# Patient Record
Sex: Male | Born: 1995 | Race: Black or African American | Hispanic: No | Marital: Single | Smoking: Never smoker
Health system: Southern US, Community
[De-identification: ages and names within clinical notes are randomized; demographics above are authoritative.]

---

## 2018-03-08 ENCOUNTER — Other Ambulatory Visit: Payer: Self-pay | Admitting: Family Medicine

## 2018-03-08 DIAGNOSIS — R1900 Intra-abdominal and pelvic swelling, mass and lump, unspecified site: Secondary | ICD-10-CM

## 2018-03-16 ENCOUNTER — Other Ambulatory Visit: Payer: Self-pay

## 2018-03-23 ENCOUNTER — Other Ambulatory Visit: Payer: Self-pay

## 2018-08-23 ENCOUNTER — Other Ambulatory Visit: Payer: Self-pay | Admitting: Family

## 2018-08-23 DIAGNOSIS — R1906 Epigastric swelling, mass or lump: Secondary | ICD-10-CM

## 2018-08-27 ENCOUNTER — Other Ambulatory Visit: Payer: Self-pay

## 2018-09-03 ENCOUNTER — Other Ambulatory Visit: Payer: Self-pay

## 2018-09-10 ENCOUNTER — Ambulatory Visit
Admission: RE | Admit: 2018-09-10 | Discharge: 2018-09-10 | Disposition: A | Discharge status: Home / Self Care | Payer: Managed Care, Other (non HMO) | Source: Ambulatory Visit | Attending: Family | Admitting: Family

## 2018-09-10 DIAGNOSIS — R1906 Epigastric swelling, mass or lump: Secondary | ICD-10-CM

## 2018-09-17 ENCOUNTER — Ambulatory Visit (HOSPITAL_COMMUNITY)
Admission: EM | Admit: 2018-09-17 | Discharge: 2018-09-17 | Disposition: A | Discharge status: Home / Self Care | Payer: Managed Care, Other (non HMO) | Attending: Emergency Medicine | Admitting: Emergency Medicine

## 2018-09-17 ENCOUNTER — Encounter (HOSPITAL_COMMUNITY): Payer: Self-pay

## 2018-09-17 ENCOUNTER — Other Ambulatory Visit: Payer: Self-pay

## 2018-09-17 DIAGNOSIS — R109 Unspecified abdominal pain: Secondary | ICD-10-CM | POA: Diagnosis present

## 2018-09-17 DIAGNOSIS — R1013 Epigastric pain: Secondary | ICD-10-CM | POA: Insufficient documentation

## 2018-09-17 DIAGNOSIS — K5900 Constipation, unspecified: Secondary | ICD-10-CM

## 2018-09-17 DIAGNOSIS — N5319 Other ejaculatory dysfunction: Secondary | ICD-10-CM | POA: Diagnosis not present

## 2018-09-17 MED ORDER — OMEPRAZOLE 20 MG PO CPDR: 20.0000 mg | DELAYED_RELEASE_CAPSULE | Freq: Every day | ORAL | 0 refills | Status: AC

## 2018-09-17 NOTE — ED Triage Notes (Signed)
Pt states he has abdominal pains for 4 months. Pt states its hurts when he starts to eat. Pt states he also has painful erections this has been going for 2 months.

## 2018-09-17 NOTE — ED Provider Notes (Signed)
[facility]    CSN: [id number] Arrival date & time: 09/17/18  0846     History   Chief Complaint Chief Complaint  Patient presents with  . Abdominal Pain    HPI Bob Parsons is a 22 y.o. male.   Bob Parsons presents with complaints of abdominal pain which has been ongoing for the past 4 months. Worse with eating. Pain near umbilicus. States he also feels pain sometimes when he is playing basketball like there is a "blockage" to upper abdomen. No fevers. No nausea or vomiting. Still eating and drinking although feels full quickly. Has been constipation. Started using miralax two days ago, had a small BM yesterday. Also complaints of his ejaculation feeling "different" for the past two months. States he experiences erection but his penis feels numb-like. He ejaculates but feels it doesn't feel the same. No pain. No penile discharge. No redness or swelling to scrotum or testicles. No sores or lesions. No pain or burning with urination. Denies any previous similar. Has not been taking any medications for any of his symptoms. Saw his school clinic in regards to his abdomen and had a complete abdominal ultrasound which was negative. Doesn't have a PCP.     ROS per HPI.      History reviewed. No pertinent past medical history.  There are no active problems to display for this patient.   History reviewed. No pertinent surgical history.     Home Medications    Prior to Admission medications   Medication Sig Start Date End Date Taking? Authorizing Provider  omeprazole (PRILOSEC) 20 MG capsule Take 1 capsule (20 mg total) by mouth daily. 09/17/18   Georgetta Haber, NP    Family History History reviewed. No pertinent family history.  Social History Social History   Tobacco Use  . Smoking status: Never Smoker  . Smokeless tobacco: Never Used  Substance Use Topics  . Alcohol use: Not on file  . Drug use: Not on file     Allergies   Patient has no allergy  information on record.   Review of Systems Review of Systems   Physical Exam Triage Vital Signs ED Triage Vitals  Enc Vitals Group     BP 09/17/18 0909 125/80     Pulse Rate 09/17/18 0909 74     Resp 09/17/18 0909 16     Temp 09/17/18 0909 98.5 F (36.9 C)     Temp Source 09/17/18 0909 Oral     SpO2 09/17/18 0909 100 %     Weight 09/17/18 0919 170 lb (77.1 kg)     Height --      Head Circumference --      Peak Flow --      Pain Score --      Pain Loc --      Pain Edu? --      Excl. in GC? --    No data found.  Updated Vital Signs BP 125/80 (BP Location: Right Arm)   Pulse 74   Temp 98.5 F (36.9 C) (Oral)   Resp 16   Wt 170 lb (77.1 kg)   SpO2 100%    Physical Exam  Constitutional: He is oriented to person, place, and time. He appears well-developed and well-nourished.  Cardiovascular: Normal rate and regular rhythm.  Pulmonary/Chest: Effort normal and breath sounds normal.  Abdominal: Soft. Normal appearance and bowel sounds are normal. There is no tenderness. There is no rigidity, no rebound, no guarding, no CVA  tenderness, no tenderness at McBurney's point and negative Murphy's sign. No hernia.  Denies scrotal redness, swelling, pain; denies sores or lesions; gu exam deferred   Neurological: He is alert and oriented to person, place, and time.  Skin: Skin is warm and dry.     UC Treatments / Results  Labs (all labs ordered are listed, but only abnormal results are displayed) Labs Reviewed  URINE CYTOLOGY ANCILLARY ONLY    EKG None  Radiology No results found.  Procedures Procedures (including critical care time)  Medications Ordered in UC Medications - No data to display  Initial Impression / Assessment and Plan / UC Course  I have reviewed the triage vital signs and the nursing notes.  Pertinent labs & imaging results that were available during my care of the patient were reviewed by me and considered in my medical decision making (see chart  for details).     Non toxic in appearance. Afebrile. Negative abdominal ultrasound on 9/30. No acute symptoms, taking PO, no nausea or vomiting. Has been constipated. Encouraged continued miralax use. Daily miralax. No acute complaints with penis or scrotum. Std screen collected and pending. Encouraged follow up with urology and/or PCP as needed for persistent symptoms. Patient verbalized understanding and agreeable to plan.   Final Clinical Impressions(s) / UC Diagnoses   Final diagnoses:  Epigastric pain  Constipation, unspecified constipation type  Abnormal ejaculation     Discharge Instructions     Please continue with daily miralax to promote regular bowel movements.  Daily omeprazole, see food recommendations to try to help with symptoms.  Please establish with a primary care provider for recheck if your symptoms persist.  Please follow up with urology as needed for further evaluation of your ejaculation concerns.  We are screening you today for STD's to ensure this is not the source of your discomfort and will call if any tests are positive.    ED Prescriptions    Medication Sig Dispense Auth. Provider   omeprazole (PRILOSEC) 20 MG capsule Take 1 capsule (20 mg total) by mouth daily. 30 capsule Georgetta Haber, NP     Controlled Substance Prescriptions [place] Controlled Substance Registry consulted? Not Applicable   Georgetta Haber, NP 09/17/18 1018

## 2018-09-17 NOTE — ED Notes (Signed)
Urine placed in lab 

## 2018-09-17 NOTE — Discharge Instructions (Addendum)
Please continue with daily miralax to promote regular bowel movements.  Daily omeprazole, see food recommendations to try to help with symptoms.  Please establish with a primary care provider for recheck if your symptoms persist.  Please follow up with urology as needed for further evaluation of your ejaculation concerns.  We are screening you today for STD's to ensure this is not the source of your discomfort and will call if any tests are positive.

## 2018-09-18 LAB — URINE CYTOLOGY ANCILLARY ONLY
CHLAMYDIA, DNA PROBE: NEGATIVE
NEISSERIA GONORRHEA: NEGATIVE
Trichomonas: NEGATIVE

## 2020-06-06 IMAGING — US US ABDOMEN COMPLETE
1 series · 13 of 25 positions shown · non-contrast
Comparison: None.

CLINICAL DATA: Palpable fullness epigastric region

EXAM:
ABDOMEN ULTRASOUND COMPLETE

[Series 1: us abdomen complete · 0.18mm/px · 13 of 91 slices shown]
[im 1/91]
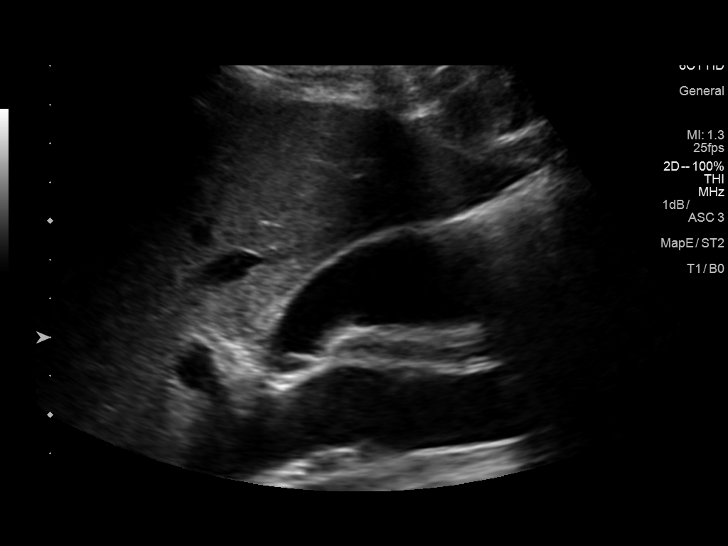
[im 8/91]
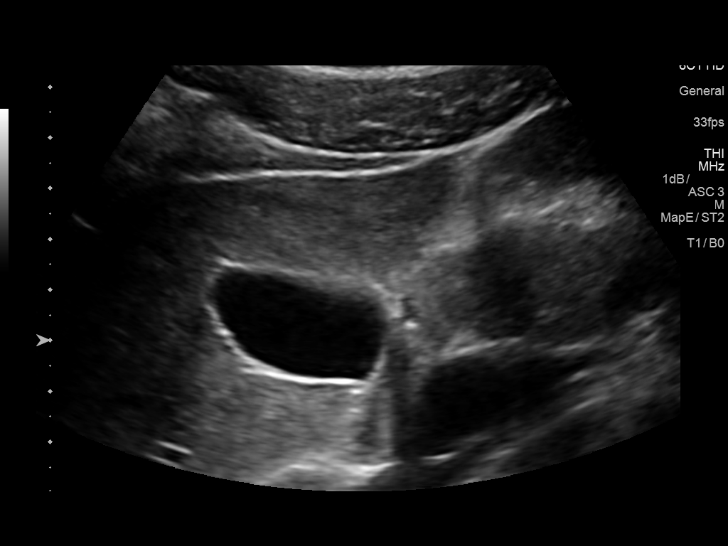
[im 16/91]
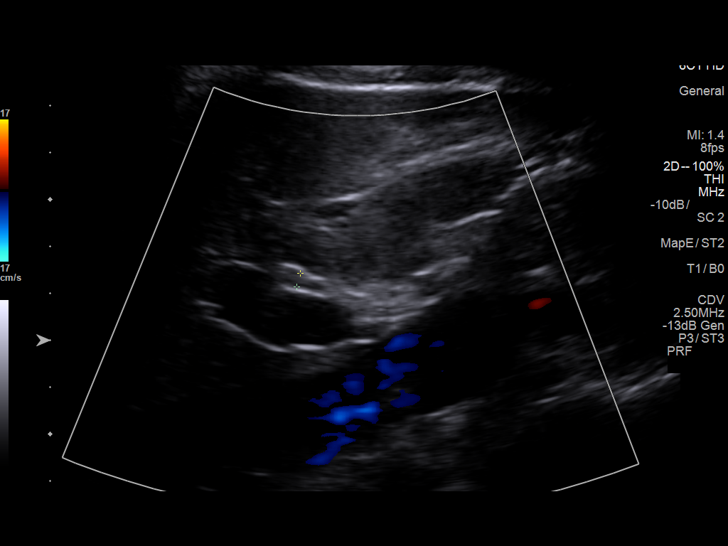
[im 23/91]
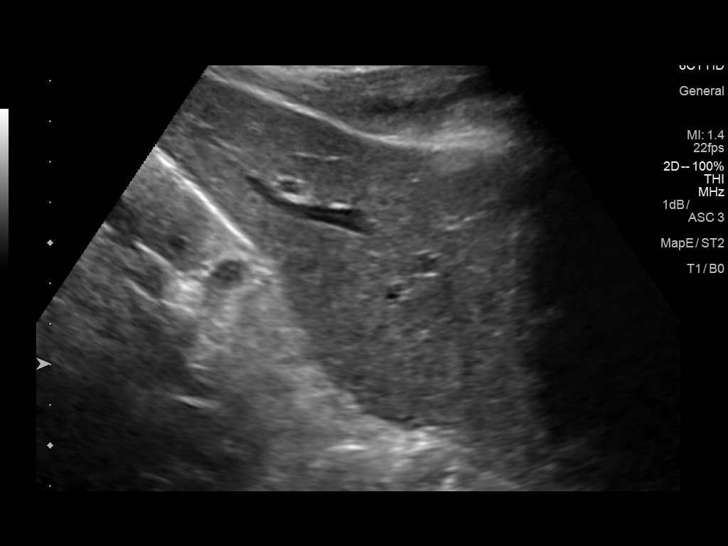
[im 31/91]
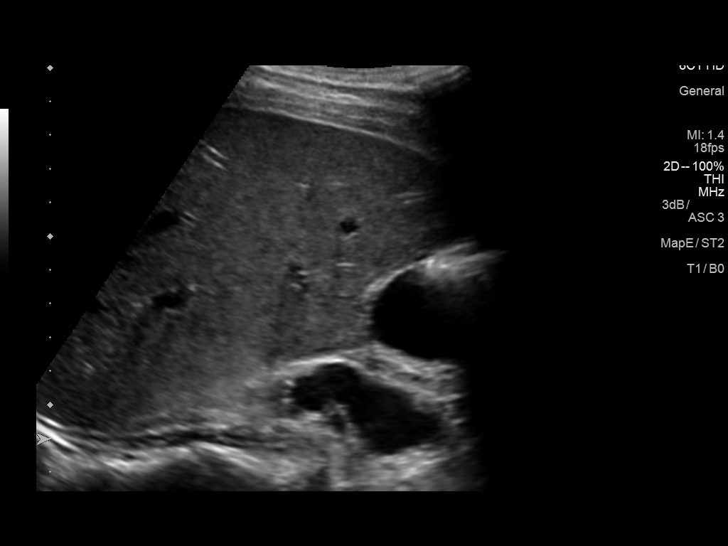
[im 38/91]
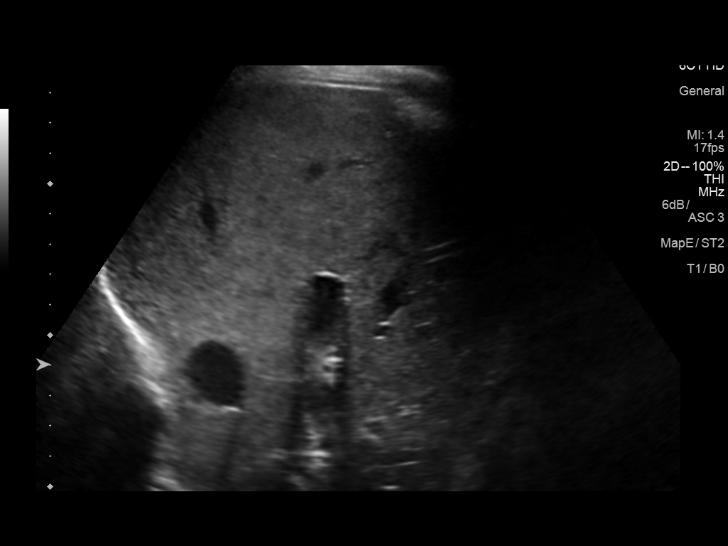
[im 46/91]
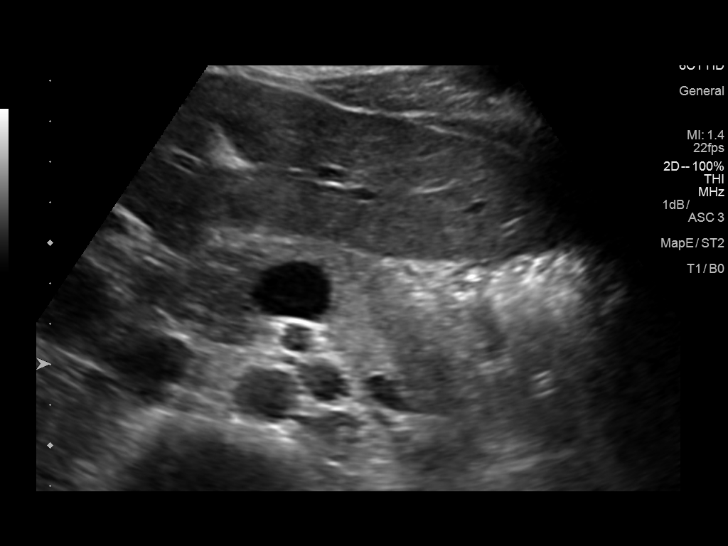
[im 53/91]
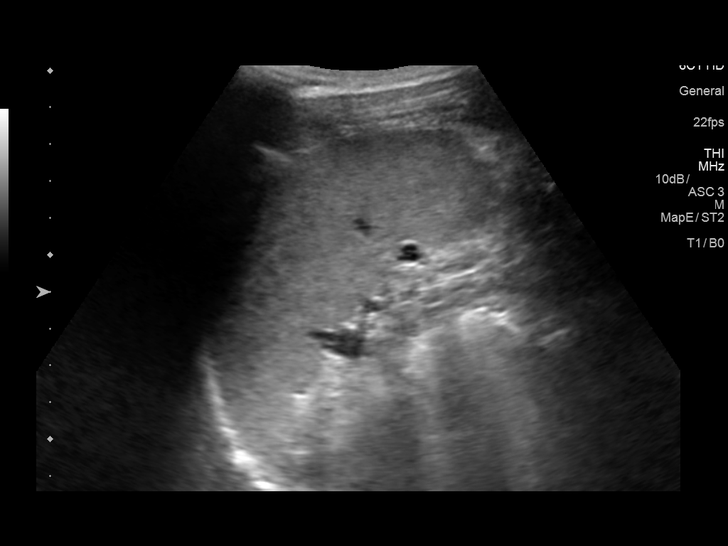
[im 61/91]
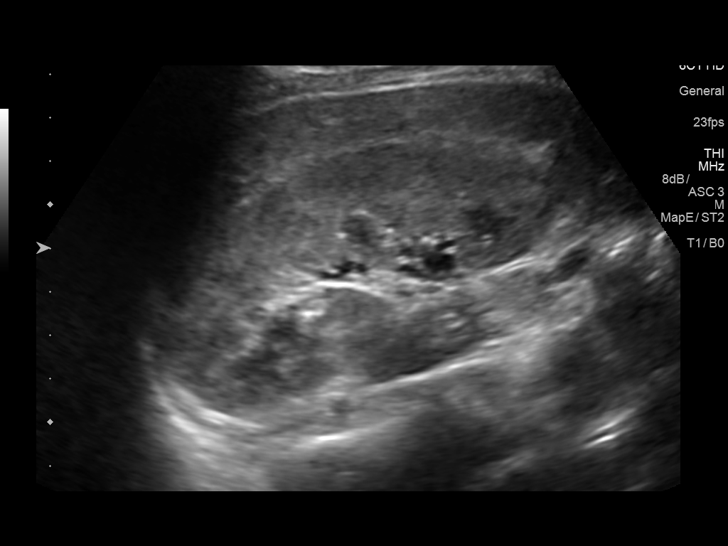
[im 68/91]
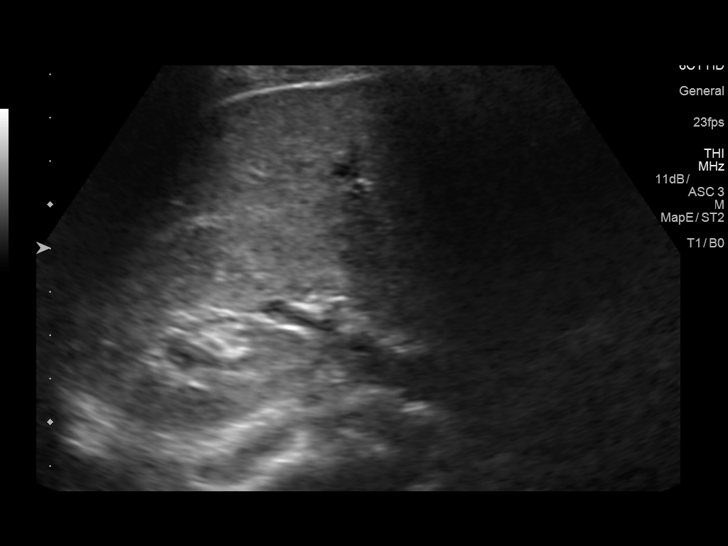
[im 76/91]
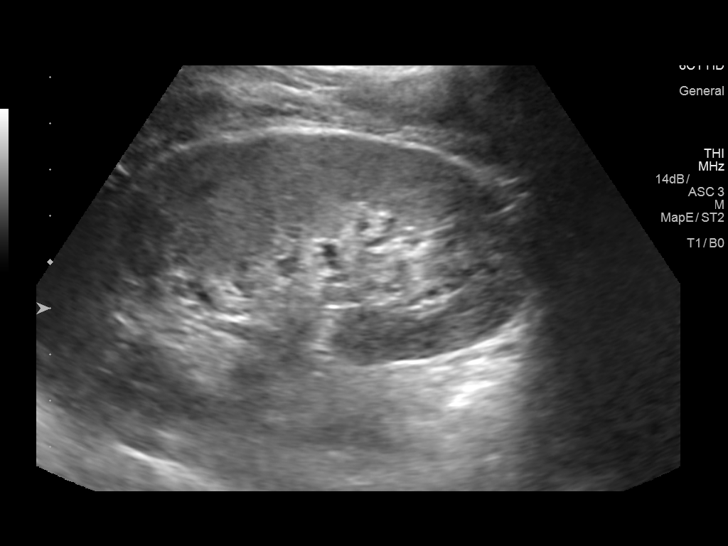
[im 83/91]
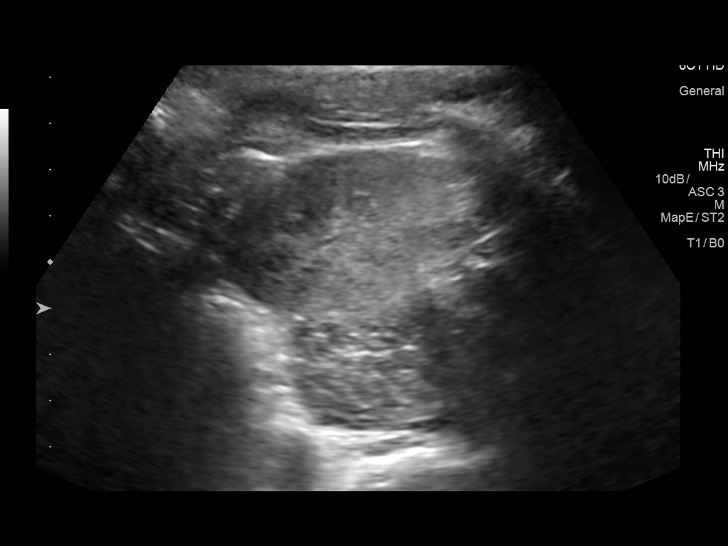
[im 91/91]
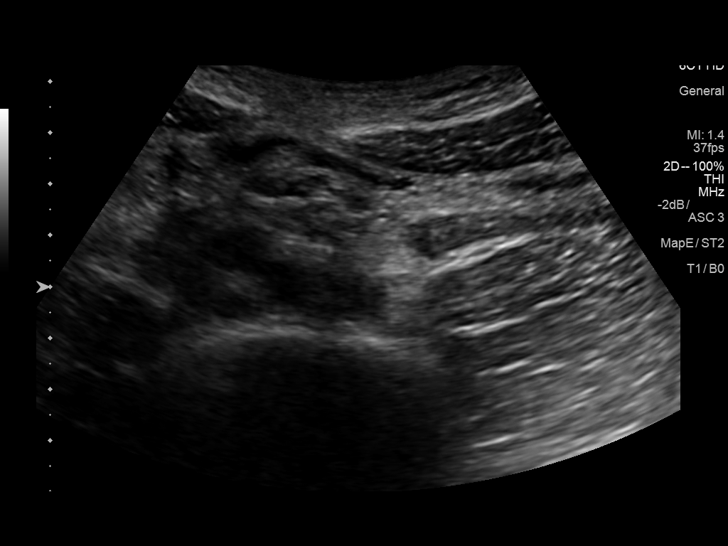

[13 of 25 positions shown; findings below may reference images not displayed]

FINDINGS: Gallbladder: No gallstones or wall thickening visualized. There is
no pericholecystic fluid. No sonographic Murphy sign noted by
sonographer.

Common bile duct: Diameter: 3 mm. No intrahepatic, common hepatic,
or common bile duct dilatation.

Liver: No focal lesion identified. Within normal limits in
parenchymal echogenicity. Portal vein is patent on color Doppler
imaging with normal direction of blood flow towards the liver.

IVC: No abnormality visualized.

Pancreas: No pancreatic mass or inflammatory focus.

Spleen: Size and appearance within normal limits.

Right Kidney: Length: 10.5 cm. Echogenicity within normal limits. No
mass or hydronephrosis visualized.

Left Kidney: Length: 10.8 cm. Echogenicity within normal limits. No
mass or hydronephrosis visualized.

Abdominal aorta: No aneurysm visualized.

Other findings: No demonstrable ascites. No mass evident by
ultrasound in the epigastric region.
IMPRESSION: Study within normal limits.

No epigastric region lesion is seen by ultrasound. If there remains
concern for pathology in this area, CT of the abdomen, ideally with
oral and intravenous contrast, could be helpful to further assess.
